# Patient Record
Sex: Female | Born: 1985 | Race: White | Hispanic: No | Marital: Single | Smoking: Former smoker
Health system: Southern US, Community
[De-identification: ages and names within clinical notes are randomized; demographics above are authoritative.]

## PROBLEM LIST (undated history)

## (undated) DIAGNOSIS — M303 Mucocutaneous lymph node syndrome [Kawasaki]: Secondary | ICD-10-CM

## (undated) DIAGNOSIS — A749 Chlamydial infection, unspecified: Secondary | ICD-10-CM

## (undated) DIAGNOSIS — Z8614 Personal history of Methicillin resistant Staphylococcus aureus infection: Secondary | ICD-10-CM

## (undated) DIAGNOSIS — IMO0002 Reserved for concepts with insufficient information to code with codable children: Secondary | ICD-10-CM

## (undated) DIAGNOSIS — R87619 Unspecified abnormal cytological findings in specimens from cervix uteri: Secondary | ICD-10-CM

## (undated) HISTORY — PX: APPENDECTOMY: SHX54

## (undated) HISTORY — PX: COLPOSCOPY: SHX161

---

## 2007-11-28 ENCOUNTER — Emergency Department (HOSPITAL_COMMUNITY): Admission: EM | Admit: 2007-11-28 | Discharge: 2007-11-28 | Payer: Self-pay | Admitting: Emergency Medicine

## 2007-12-24 ENCOUNTER — Encounter: Payer: Self-pay | Admitting: Internal Medicine

## 2008-01-25 ENCOUNTER — Ambulatory Visit: Payer: Self-pay | Admitting: Internal Medicine

## 2008-01-25 DIAGNOSIS — R112 Nausea with vomiting, unspecified: Secondary | ICD-10-CM

## 2010-04-18 ENCOUNTER — Encounter: Payer: Self-pay | Admitting: Internal Medicine

## 2010-04-27 ENCOUNTER — Encounter: Admission: RE | Admit: 2010-04-27 | Payer: Self-pay | Source: Home / Self Care | Admitting: General Surgery

## 2010-07-23 LAB — ABO/RH

## 2010-07-23 LAB — ANTIBODY SCREEN: Antibody Screen: NEGATIVE

## 2010-07-23 LAB — HIV ANTIBODY (ROUTINE TESTING W REFLEX): HIV: NONREACTIVE

## 2010-09-02 ENCOUNTER — Ambulatory Visit: Payer: Self-pay | Admitting: Cardiovascular Disease

## 2010-12-13 LAB — STREP B DNA PROBE: GBS: POSITIVE

## 2010-12-19 ENCOUNTER — Encounter (HOSPITAL_COMMUNITY): Payer: Self-pay | Admitting: *Deleted

## 2010-12-19 ENCOUNTER — Inpatient Hospital Stay (HOSPITAL_COMMUNITY)
Admission: AD | Admit: 2010-12-19 | Discharge: 2010-12-19 | Disposition: A | Discharge status: Home / Self Care | Payer: Self-pay | Source: Ambulatory Visit | Attending: Obstetrics and Gynecology | Admitting: Obstetrics and Gynecology

## 2010-12-19 DIAGNOSIS — O479 False labor, unspecified: Secondary | ICD-10-CM | POA: Insufficient documentation

## 2010-12-19 DIAGNOSIS — O471 False labor at or after 37 completed weeks of gestation: Secondary | ICD-10-CM

## 2010-12-19 NOTE — Progress Notes (Signed)
Pt G1 at 37.6wks, having contractions since 1630, denies bleeding.  Reports a small gush of fluid last night, no fluid since.

## 2010-12-19 NOTE — ED Provider Notes (Signed)
History   Esperanza Koury is a 25y.o. Female at 37.6 weeks who presents for labor check.  Called after-hrs number around 1755 reporting ctxs every 10 minutes for one hr.  Pt given labor precautions and instructed to observe, and called back in an hr reporting ctxs closer and "much stronger," and desired to come in for labor eval.  Once arriving reports ctxs spaced since sitting down and resting.  IC this AM.  No VB, but reports one incident of ?LOF yesterday, but none since.  Denies PIH or UTI s/s; no GI or resp c/o's.  Hx r/f:  1.  Questionable LMP and EDC by uls  2.  Migraines  3.  H/o Kowasaki dz  4.  Questionable inguinal hernia  5.  H/o chlamydia 1st trimester  6.  H/o abnl pap  Chief Complaint  Patient presents with  . Contractions   HPI  OB History    Grav Para Term Preterm Abortions TAB SAB Ect Mult Living   1               No past medical history on file.  No past surgical history on file.  No family history on file.  History  Substance Use Topics  . Smoking status: Not on file  . Smokeless tobacco: Not on file  . Alcohol Use: Not on file    Allergies: Allergies not on file  No prescriptions prior to admission    ROS see HPI Physical Exam   Blood pressure 116/77, pulse 70, temperature 98.2 F (36.8 C), temperature source Oral, resp. rate 18, height 5' 3.5" (1.613 m), weight 84.097 kg (185 lb 6.4 oz).  Physical Exam  Constitutional: She is oriented to person, place, and time. She appears well-developed and well-nourished. No distress.       anxious  Cardiovascular: Normal rate and regular rhythm.   Respiratory: Effort normal and breath sounds normal.  GI: Soft. Bowel sounds are normal.  Genitourinary:       Cx:  1.5/75/-2 posterior  Neurological: She is alert and oriented to person, place, and time.  Skin: Skin is warm and dry.   EFM:  140, reactive, moderate variability, no decels TOCO:  Rare ctx MAU Course  Procedures 1.  NST  Assessment and Plan  1.   IUP at 37.6 2.  No cx change since exam in office last week 3.  Reactive NST  1.  D/c home with labor precautions and FKC 2.  Disc'd comfort measures, benadryl prn insomnia 3.  F/u as scheduled this week at [facility] or prn  Edita Weyenberg H 12/19/2010, 7:56 PM

## 2011-01-01 ENCOUNTER — Inpatient Hospital Stay (HOSPITAL_COMMUNITY)
Admission: AD | Admit: 2011-01-01 | Discharge: 2011-01-02 | DRG: 774 | Disposition: A | Discharge status: Home / Self Care | Payer: Medicaid Other | Source: Ambulatory Visit | Attending: Obstetrics and Gynecology | Admitting: Obstetrics and Gynecology

## 2011-01-01 ENCOUNTER — Inpatient Hospital Stay (HOSPITAL_COMMUNITY): Payer: Medicaid Other | Admitting: Anesthesiology

## 2011-01-01 ENCOUNTER — Encounter (HOSPITAL_COMMUNITY): Payer: Self-pay

## 2011-01-01 ENCOUNTER — Encounter (HOSPITAL_COMMUNITY): Payer: Self-pay | Admitting: Anesthesiology

## 2011-01-01 DIAGNOSIS — O9903 Anemia complicating the puerperium: Secondary | ICD-10-CM | POA: Diagnosis not present

## 2011-01-01 DIAGNOSIS — Z3490 Encounter for supervision of normal pregnancy, unspecified, unspecified trimester: Secondary | ICD-10-CM

## 2011-01-01 DIAGNOSIS — IMO0001 Reserved for inherently not codable concepts without codable children: Secondary | ICD-10-CM

## 2011-01-01 DIAGNOSIS — Z22330 Carrier of Group B streptococcus: Secondary | ICD-10-CM

## 2011-01-01 DIAGNOSIS — O99892 Other specified diseases and conditions complicating childbirth: Secondary | ICD-10-CM | POA: Diagnosis present

## 2011-01-01 DIAGNOSIS — D649 Anemia, unspecified: Secondary | ICD-10-CM | POA: Diagnosis not present

## 2011-01-01 HISTORY — DX: Unspecified abnormal cytological findings in specimens from cervix uteri: R87.619

## 2011-01-01 HISTORY — DX: Chlamydial infection, unspecified: A74.9

## 2011-01-01 HISTORY — DX: Reserved for concepts with insufficient information to code with codable children: IMO0002

## 2011-01-01 HISTORY — DX: Mucocutaneous lymph node syndrome (kawasaki): M30.3

## 2011-01-01 HISTORY — DX: Personal history of Methicillin resistant Staphylococcus aureus infection: Z86.14

## 2011-01-01 LAB — CBC
HCT: 34.3 % — ABNORMAL LOW (ref 36.0–46.0)
MCH: 29.3 pg (ref 26.0–34.0)
MCHC: 33.5 g/dL (ref 30.0–36.0)
MCV: 87.3 fL (ref 78.0–100.0)
RDW: 12.7 % (ref 11.5–15.5)

## 2011-01-01 MED ORDER — BENZOCAINE-MENTHOL 20-0.5 % EX AERO
1.0000 "application " | INHALATION_SPRAY | CUTANEOUS | Status: DC | PRN
  Administered 2011-01-01: 1 via TOPICAL

## 2011-01-01 MED ORDER — HYDROXYZINE HCL 50 MG PO TABS: 50.0000 mg | ORAL_TABLET | Freq: Four times a day (QID) | ORAL | Status: DC | PRN

## 2011-01-01 MED ORDER — AMPICILLIN-SULBACTAM SODIUM 3 (2-1) G IJ SOLR
3.0000 g | Freq: Once | INTRAMUSCULAR | Status: AC
  Administered 2011-01-01: 3 g via INTRAVENOUS
  Filled 2011-01-01: qty 3

## 2011-01-01 MED ORDER — OXYTOCIN 20 UNITS IN LACTATED RINGERS INFUSION - SIMPLE: 125.0000 mL/h | INTRAVENOUS | Status: DC | PRN

## 2011-01-01 MED ORDER — PHENYLEPHRINE 40 MCG/ML (10ML) SYRINGE FOR IV PUSH (FOR BLOOD PRESSURE SUPPORT): 80.0000 ug | PREFILLED_SYRINGE | INTRAVENOUS | Status: DC | PRN

## 2011-01-01 MED ORDER — ONDANSETRON HCL 4 MG PO TABS: 4.0000 mg | ORAL_TABLET | ORAL | Status: DC | PRN

## 2011-01-01 MED ORDER — DIBUCAINE 1 % RE OINT: 1.0000 "application " | TOPICAL_OINTMENT | RECTAL | Status: DC | PRN

## 2011-01-01 MED ORDER — SODIUM CHLORIDE 0.9 % IV SOLN
3.0000 g | Freq: Once | INTRAVENOUS | Status: AC
  Administered 2011-01-01: 3 g via INTRAVENOUS
  Filled 2011-01-01: qty 3

## 2011-01-01 MED ORDER — OXYTOCIN BOLUS FROM INFUSION
500.0000 mL | Freq: Once | INTRAVENOUS | Status: DC
  Filled 2011-01-01: qty 1000
  Filled 2011-01-01: qty 500

## 2011-01-01 MED ORDER — TETANUS-DIPHTH-ACELL PERTUSSIS 5-2.5-18.5 LF-MCG/0.5 IM SUSP
0.5000 mL | Freq: Once | INTRAMUSCULAR | Status: AC
  Administered 2011-01-02: 0.5 mL via INTRAMUSCULAR

## 2011-01-01 MED ORDER — ACETAMINOPHEN 325 MG PO TABS
650.0000 mg | ORAL_TABLET | ORAL | Status: DC | PRN
  Administered 2011-01-01: 650 mg via ORAL
  Filled 2011-01-01: qty 2

## 2011-01-01 MED ORDER — EPHEDRINE 5 MG/ML INJ: 10.0000 mg | INTRAVENOUS | Status: DC | PRN

## 2011-01-01 MED ORDER — LIDOCAINE HCL (PF) 1 % IJ SOLN
30.0000 mL | INTRAMUSCULAR | Status: DC | PRN
  Filled 2011-01-01: qty 30

## 2011-01-01 MED ORDER — IBUPROFEN 600 MG PO TABS
600.0000 mg | ORAL_TABLET | Freq: Four times a day (QID) | ORAL | Status: DC | PRN
  Administered 2011-01-01: 600 mg via ORAL
  Filled 2011-01-01 (×2): qty 1

## 2011-01-01 MED ORDER — LANOLIN HYDROUS EX OINT: TOPICAL_OINTMENT | CUTANEOUS | Status: DC | PRN

## 2011-01-01 MED ORDER — IBUPROFEN 600 MG PO TABS
600.0000 mg | ORAL_TABLET | Freq: Four times a day (QID) | ORAL | Status: DC
  Filled 2011-01-01: qty 1

## 2011-01-01 MED ORDER — PENICILLIN G POTASSIUM 5000000 UNITS IJ SOLR: 5.0000 10*6.[IU] | Freq: Once | INTRAVENOUS | Status: DC

## 2011-01-01 MED ORDER — ONDANSETRON HCL 4 MG/2ML IJ SOLN: 4.0000 mg | Freq: Four times a day (QID) | INTRAMUSCULAR | Status: DC | PRN

## 2011-01-01 MED ORDER — DEXTROSE 5 % IV SOLN: 2.5000 10*6.[IU] | INTRAVENOUS | Status: DC

## 2011-01-01 MED ORDER — OXYCODONE-ACETAMINOPHEN 5-325 MG PO TABS: 2.0000 | ORAL_TABLET | ORAL | Status: DC | PRN

## 2011-01-01 MED ORDER — PRENATAL PLUS 27-1 MG PO TABS
1.0000 | ORAL_TABLET | Freq: Every day | ORAL | Status: DC
  Administered 2011-01-01 – 2011-01-02 (×2): 1 via ORAL
  Filled 2011-01-01 (×2): qty 1

## 2011-01-01 MED ORDER — DIPHENHYDRAMINE HCL 50 MG/ML IJ SOLN: 12.5000 mg | INTRAMUSCULAR | Status: DC | PRN

## 2011-01-01 MED ORDER — SIMETHICONE 80 MG PO CHEW: 80.0000 mg | CHEWABLE_TABLET | ORAL | Status: DC | PRN

## 2011-01-01 MED ORDER — NAPROXEN 250 MG PO TABS
500.0000 mg | ORAL_TABLET | Freq: Two times a day (BID) | ORAL | Status: DC | PRN
  Filled 2011-01-01: qty 2

## 2011-01-01 MED ORDER — BENZOCAINE-MENTHOL 20-0.5 % EX AERO
INHALATION_SPRAY | CUTANEOUS | Status: AC
  Administered 2011-01-01: 1 via TOPICAL
  Filled 2011-01-01: qty 56

## 2011-01-01 MED ORDER — EPHEDRINE 5 MG/ML INJ
10.0000 mg | INTRAVENOUS | Status: DC | PRN
  Filled 2011-01-01: qty 4

## 2011-01-01 MED ORDER — OXYTOCIN 20 UNITS IN LACTATED RINGERS INFUSION - SIMPLE
125.0000 mL/h | Freq: Once | INTRAVENOUS | Status: DC
  Administered 2011-01-01: 500 mL/h via INTRAVENOUS

## 2011-01-01 MED ORDER — FENTANYL 2.5 MCG/ML BUPIVACAINE 1/10 % EPIDURAL INFUSION (WH - ANES)
14.0000 mL/h | INTRAMUSCULAR | Status: DC
  Administered 2011-01-01 (×5): 14 mL/h via EPIDURAL
  Filled 2011-01-01 (×5): qty 60

## 2011-01-01 MED ORDER — ONDANSETRON HCL 4 MG/2ML IJ SOLN: 4.0000 mg | INTRAMUSCULAR | Status: DC | PRN

## 2011-01-01 MED ORDER — DIPHENHYDRAMINE HCL 25 MG PO CAPS: 25.0000 mg | ORAL_CAPSULE | Freq: Four times a day (QID) | ORAL | Status: DC | PRN

## 2011-01-01 MED ORDER — OXYCODONE-ACETAMINOPHEN 5-325 MG PO TABS
1.0000 | ORAL_TABLET | ORAL | Status: DC | PRN
  Administered 2011-01-01 – 2011-01-02 (×5): 1 via ORAL
  Filled 2011-01-01 (×5): qty 1

## 2011-01-01 MED ORDER — WITCH HAZEL-GLYCERIN EX PADS: 1.0000 "application " | MEDICATED_PAD | CUTANEOUS | Status: DC | PRN

## 2011-01-01 MED ORDER — PHENYLEPHRINE 40 MCG/ML (10ML) SYRINGE FOR IV PUSH (FOR BLOOD PRESSURE SUPPORT)
80.0000 ug | PREFILLED_SYRINGE | INTRAVENOUS | Status: DC | PRN
  Filled 2011-01-01: qty 5

## 2011-01-01 MED ORDER — LIDOCAINE HCL 1.5 % IJ SOLN
INTRAMUSCULAR | Status: DC | PRN
  Administered 2011-01-01: 2 mL via INTRADERMAL
  Administered 2011-01-01 (×2): 5 mL via INTRADERMAL

## 2011-01-01 MED ORDER — ZOLPIDEM TARTRATE 5 MG PO TABS: 5.0000 mg | ORAL_TABLET | Freq: Every evening | ORAL | Status: DC | PRN

## 2011-01-01 MED ORDER — PENICILLIN G POTASSIUM 5000000 UNITS IJ SOLR
2.5000 10*6.[IU] | INTRAVENOUS | Status: DC
  Administered 2011-01-01 (×3): 2.5 10*6.[IU] via INTRAVENOUS
  Filled 2011-01-01 (×6): qty 2.5

## 2011-01-01 MED ORDER — CITRIC ACID-SODIUM CITRATE 334-500 MG/5ML PO SOLN: 30.0000 mL | ORAL | Status: DC | PRN

## 2011-01-01 MED ORDER — LACTATED RINGERS IV SOLN
INTRAVENOUS | Status: DC
  Administered 2011-01-01 (×2): via INTRAVENOUS

## 2011-01-01 MED ORDER — FLEET ENEMA 7-19 GM/118ML RE ENEM: 1.0000 | ENEMA | RECTAL | Status: DC | PRN

## 2011-01-01 MED ORDER — HYDROXYZINE HCL 50 MG/ML IM SOLN: 50.0000 mg | Freq: Four times a day (QID) | INTRAMUSCULAR | Status: DC | PRN

## 2011-01-01 MED ORDER — SENNOSIDES-DOCUSATE SODIUM 8.6-50 MG PO TABS
2.0000 | ORAL_TABLET | Freq: Every day | ORAL | Status: DC
  Administered 2011-01-01: 2 via ORAL

## 2011-01-01 MED ORDER — LACTATED RINGERS IV SOLN: 500.0000 mL | Freq: Once | INTRAVENOUS | Status: DC

## 2011-01-01 MED ORDER — PENICILLIN G POTASSIUM 5000000 UNITS IJ SOLR
5.0000 10*6.[IU] | Freq: Once | INTRAVENOUS | Status: AC
  Administered 2011-01-01: 5 10*6.[IU] via INTRAVENOUS
  Filled 2011-01-01: qty 5

## 2011-01-01 MED ORDER — BUTORPHANOL TARTRATE 2 MG/ML IJ SOLN: 1.0000 mg | INTRAMUSCULAR | Status: DC | PRN

## 2011-01-01 MED ORDER — LACTATED RINGERS IV SOLN: 500.0000 mL | INTRAVENOUS | Status: DC | PRN

## 2011-01-01 NOTE — Progress Notes (Addendum)
Dayne L Stifter is a 25 y.o. G1P0 at [redacted]w[redacted]d by LMP admitted for active labor  Subjective:  Pt reports feeling some rectal pressure but overall comfortable with epidural.  Objective:  Alert, oriented, NAD Heart:  RRR Lungs:  Clear Abd:  Soft/gravid/NT EFW 8# to leopolds   FHT:  FHR: 120 bpm, variability: moderate,  accelerations:  Present,  decelerations:  Present intermittent mild variable decels. UC:   regular, every 2-3 minutes SVE:   Dilation 7-8cm Effacement: 100% Station: 0  Labs: Lab Results  Component Value Date   WBC 14.0* 01/01/2011   HGB 11.5* 01/01/2011   HCT 34.3* 01/01/2011   MCV 87.3 01/01/2011   PLT 228 01/01/2011    Assessment / Plan: Spontaneous labor, progressing normally  Labor: Progressing normally Preeclampsia:  no signs or symptoms of toxicity Fetal Wellbeing:  Category II Pain Control:  Epidural I/D:  n/a Anticipated MOD:  NSVD  Lawan Nanez O. 01/01/2011, 4:42 PM  Pts BOE by Ultrasound.

## 2011-01-01 NOTE — Progress Notes (Signed)
Sally Gibson is a 25 y.o. G1P1001 at [redacted]w[redacted]d by ultrasound admitted for active labor  Subjective: Pt resting comfortably at present and reports decreased rectal pressure at present.  Objective: Temp: 99.8 BP:125/60  P: 83  R 16  FHT:  FHR: 150 bpm, variability: moderate,  accelerations:  Present,  decelerations:  Present Intermittent mild variables. UC:   regular, every 2-3 minutes SVE:   Dilation: 9.5, anterior lip-contd edema noted.  OP presentation.   Effacement: 100% Station: +1  Labs: Lab Results  Component Value Date   WBC 14.0* 01/01/2011   HGB 11.5* 01/01/2011   HCT 34.3* 01/01/2011   MCV 87.3 01/01/2011   PLT 228 01/01/2011    Assessment / Plan: Spontaneous labor with no progress  Labor: No progress at present Preeclampsia:  no signs or symptoms of toxicity Fetal Wellbeing:  Category I Pain Control:  Epidural I/D:  n/a Anticipated MOD:  NSVD  IUPC inserted without difficulty to assess uterine contraction strength.  Will begin pitocin if inadequate MVU's and continue to observe carefully.  Position changes to facilitate fetal head rotation.    Grover Robinson O. 01/01/2011, 7:18 PM

## 2011-01-01 NOTE — Plan of Care (Signed)
Problem: Consults Goal: Birthing Suites Patient Information Press F2 to bring up selections list   Pt 37-[redacted] weeks EGA     

## 2011-01-01 NOTE — Anesthesia Procedure Notes (Signed)
Epidural Patient location during procedure: OB Start time: 01/01/2011 4:07 AM Reason for block: procedure for pain  Staffing Performed by: anesthesiologist   Preanesthetic Checklist Completed: patient identified, site marked, surgical consent, pre-op evaluation, timeout performed, IV checked, risks and benefits discussed and monitors and equipment checked  Epidural Patient position: sitting Prep: site prepped and draped and DuraPrep Patient monitoring: continuous pulse ox and blood pressure Approach: midline Injection technique: LOR air  Needle:  Needle type: Tuohy  Needle gauge: 17 G Needle length: 9 cm Needle insertion depth: 6 cm Catheter type: closed end flexible Catheter size: 19 Gauge Catheter at skin depth: 11 cm Test dose: negative  Assessment Events: blood not aspirated, injection not painful, no injection resistance, negative IV test and no paresthesia  Additional Notes Discussed risk of headache, infection, bleeding, nerve injury and failed or incomplete block.  Patient voices understanding and wishes to proceed.

## 2011-01-01 NOTE — Progress Notes (Signed)
Delivery Note At 5:38 PM a viable female was delivered via Vaginal, Spontaneous Delivery (Presentation: Left Occiput Posterior).  No nuchal cord present.  No difficulty with shoulders.   Infant to mat chest upon delivery and had spontaneous cry with stimulation.  Cord doubly clamped and cord cut by FOB.   APGAR: 8, 9; weight 7 lb 12 oz (3515 g).   Placenta status: Intact, Spontaneous.  Cord: 3 vessels with the following complications: None.  Cord pH: N/A  Anesthesia: Epidural  Episiotomy: None Lacerations:1st degree perineal Suture Repair: 3.0 monocryl Est. Blood Loss (mL):  Mom to postpartum.  Baby to nursery-stable.  Sally Ambrocio O. 01/01/2011, 6:34 PM

## 2011-01-01 NOTE — Anesthesia Preprocedure Evaluation (Signed)
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 - has never had any problems or needed followup   Neuro/Psych  Headaches (last migraine first trimester), Negative Psych ROS   GI/Hepatic negative GI ROS Neg liver ROS    Endo/Other  Negative Endocrine ROS  Renal/GU negative Renal ROS     Musculoskeletal   Abdominal   Peds  Hematology negative hematology ROS (+)   Anesthesia Other Findings   Reproductive/Obstetrics (+) Pregnancy                           Anesthesia Physical Anesthesia Plan  ASA: II  Anesthesia Plan: Epidural   Post-op Pain Management:    Induction:   Airway Management Planned:   Additional Equipment:   Intra-op Plan:   Post-operative Plan:   Informed Consent: I have reviewed the patients History and Physical, chart, labs and discussed the procedure including the risks, benefits and alternatives for the proposed anesthesia with the patient or authorized representative who has indicated his/her understanding and acceptance.     Plan Discussed with:   Anesthesia Plan Comments:         Anesthesia Quick Evaluation

## 2011-01-01 NOTE — Progress Notes (Signed)
Patient is here for labor eval. She denies any lof. C/o of ctx q10mins, bloody show. Reports good fetal movement

## 2011-01-01 NOTE — Progress Notes (Signed)
Sally Gibson is a 25 y.o. G1P1001 at [redacted]w[redacted]d by ultrasound admitted for active labor    Subjective: Pt c/o contd rectal pressure.    Objective: Afebrile.  BP 96/62, P 90, R 18      FHT:  FHR: 140 bpm, variability: moderate,  accelerations:  Present,  decelerations:  Absent UC:   regular, every 2-4 minutes SVE:  Dilation: 9.5cm.  Sl swollen anterior lip. Effacement: 100% Station: +1  Labs: Lab Results  Component Value Date   WBC 14.0* 01/01/2011   HGB 11.5* 01/01/2011   HCT 34.3* 01/01/2011   MCV 87.3 01/01/2011   PLT 228 01/01/2011    Assessment / Plan: Spontaneous labor with slow progress Dr. Pennie Rushing updated.  Labor: Slow progress spontaneous labor Preeclampsia:  no signs or symptoms of toxicity Fetal Wellbeing:  Category I Pain Control:  Labor support with Epidural I/D:  n/a Anticipated MOD:  NSVD  Continue with expectant management.  Sally Gibson O. 01/01/2011, 6:56 PM

## 2011-01-01 NOTE — Progress Notes (Addendum)
Eyleen L Schumpert is a 25 y.o. G1P0 at [redacted]w[redacted]d by LMP admitted for active labor  Subjective: Pt reports some contd rectal pressure but some relief after redosing of epidural.  Objective:  AF.  VSS   FHT:  FHR: 120 bpm, variability: moderate,  accelerations:  Present,  decelerations:  Absent UC:   regular, every 2-3 minutes SVE:   Dilation 9cm Effacement: 100% Station: +1  Labs: Lab Results  Component Value Date   WBC 14.0* 01/01/2011   HGB 11.5* 01/01/2011   HCT 34.3* 01/01/2011   MCV 87.3 01/01/2011   PLT 228 01/01/2011    Assessment / Plan: Spontaneous labor, progressing normally  Labor: Progressing normally Preeclampsia:  no signs or symptoms of toxicity Fetal Wellbeing:  Category I Pain Control:  Epidural I/D:  n/a Anticipated MOD:  NSVD  Otie Headlee O. 01/01/2011, 4:46 PM  Pt's BOE by Ultrasound.

## 2011-01-01 NOTE — H&P (Signed)
Sally Gibson is a 25 y.o. female presenting unannounced at 39.5 weeks for labor check.  Desires epidural.  Reports bloody show intermittently yesterday; no LOF, UTI, or PIH s/s.  Desires CNM care.  Significant other at bedside and supportive; pt's mother at bedside as well.  Contractions regular since around Midnight. Cx at last check 2cm. Maternal Medical History:  Reason for admission: Reason for admission: contractions.  Contractions: Onset was 3-5 hours ago.   Frequency: regular.   Perceived severity is strong.   Labor onset around MN  Fetal activity: Perceived fetal activity is normal.   Last perceived fetal movement was within the past hour.    Prenatal complications: 1.  Unsure LMP--dates by 6 week u/s 2.  H/o abnl pap 3.  H/o chlamydia in past and in 1st trimester 4.  GBS pos 5.  H/o Kawasaki dz at 35yrs of age 56.  Questionable inguinal hernia    OB History    Grav Para Term Preterm Abortions TAB SAB Ect Mult Living   1              Past Medical History  Diagnosis Date  . History of MRSA infection     x2  . Kawasaki syndrome     at age 15  . Chlamydia   . Migraine   . Abnormal Pap smear    Past Surgical History  Procedure Date  . Appendectomy   . Colposcopy    Family History: family history is not on file. Social History:  reports that she has quit smoking. Her smoking use included Cigarettes. She does not have any smokeless tobacco history on file. She reports that she does not drink alcohol or use illicit drugs.  Review of Systems  Constitutional: Negative.   Respiratory: Negative.   Cardiovascular: Negative.   Gastrointestinal: Negative.   Genitourinary: Negative.     Dilation: 2.5 Effacement (%): 60 Station: -3 Exam by:: [employer] Blood pressure 139/72, pulse 68, temperature 97.9 F (36.6 C), temperature source Oral, resp. rate 20, SpO2 99.00%. Maternal Exam:  Uterine Assessment: Contraction strength is moderate.  Contraction frequency is  regular.  ctxs q 2-3 min  Abdomen: Patient reports no abdominal tenderness. Estimated fetal weight is 7-8 lbs.   Fetal presentation: vertex  Introitus: Normal vulva. Pelvis: adequate for delivery.   Cervix: Cervix evaluated by digital exam.     Fetal Exam Fetal Monitor Review: Mode: ultrasound.   Baseline rate: 125.  Variability: moderate (6-25 bpm).   Pattern: accelerations present and no decelerations.    Fetal State Assessment: Category I - tracings are normal.     Physical Exam  Constitutional: She is oriented to person, place, and time. She appears well-developed and well-nourished. She appears distressed.       Grimacing, breathing heavily w/ ctxs, tense, difficulty relaxing, hard time even sitting still  Cardiovascular: Normal rate and regular rhythm.   Respiratory: Effort normal and breath sounds normal.  GI: Soft. Bowel sounds are normal.  Genitourinary:       Cx:  3/100/-1, posterior, BBOW  Neurological: She is alert and oriented to person, place, and time. She has normal reflexes.  Skin: Skin is warm and dry.    Prenatal labs: ABO, Rh: A/Positive/-- (04/27 0000) Antibody: Negative (04/27 0000) Rubella: Immune (04/27 0000) RPR: Nonreactive (04/27 0000)  HBsAg: Negative (04/27 0000)  HIV: Non-reactive (04/27 0000)  GBS: Positive (09/17 0000)  1hr gtt nml=119  Assessment/Plan: 1.  IUP at 39.5 2.  Active  labor 3.  GBS positive 4.  Cat I FHT  1.  Admit to BS with rout L&D orders with dr. Pennie Rushing as attending 2.  PCN-G per GBS protocol 3.  Epidural asap 4.  AROM and/or Pitocin prn augmentation 5.  C/w MD prn      Nikyla Navedo H 01/01/2011, 3:47 AM

## 2011-01-01 NOTE — Progress Notes (Signed)
Sally Gibson is a 25 y.o. G1P1001 at [redacted]w[redacted]d by ultrasound admitted for active labor  Subjective: Pt continues to be overall comfortable with some rectal pressure at times.  Called by RN due to elevated temperature.  Tylenol already given.  Objective: Temp:  102.8  BP: 111/55; P:109; R: 18   FHT:  FHR: 160 bpm, variability: moderate,  accelerations:  Present,  decelerations:  Present intermittent variable decels present. UC:   regular, every 2-3 minutes.  IUPC with MVUs 200-210. SVE:   Dilation: 10 Effacement (%): 100 Station: +1 Exam by:: n Sally Gibson, cnm Caput at +2 to +3 Labs: Lab Results  Component Value Date   WBC 14.0* 01/01/2011   HGB 11.5* 01/01/2011   HCT 34.3* 01/01/2011   MCV 87.3 01/01/2011   PLT 228 01/01/2011    Assessment / Plan: Protracted active phase Maternal fever  Labor: Protraced active phase Preeclampsia:  no signs or symptoms of toxicity Fetal Wellbeing:  Category II Pain Control:  Epidural I/D:  n/a Anticipated MOD:  NSVD  Will begin pushing.  Dr. Pennie Rushing updated and additional antibiotics ordered.  Continue to observe closely.  Rhylen Pulido O. 01/01/2011, 8:06 PM

## 2011-01-02 DIAGNOSIS — Z3490 Encounter for supervision of normal pregnancy, unspecified, unspecified trimester: Secondary | ICD-10-CM

## 2011-01-02 DIAGNOSIS — Z22330 Carrier of Group B streptococcus: Secondary | ICD-10-CM

## 2011-01-02 LAB — CBC
HCT: 30.4 % — ABNORMAL LOW (ref 36.0–46.0)
MCH: 29.6 pg (ref 26.0–34.0)
MCV: 89.1 fL (ref 78.0–100.0)
Platelets: 176 10*3/uL (ref 150–400)
RBC: 3.41 MIL/uL — ABNORMAL LOW (ref 3.87–5.11)

## 2011-01-02 MED ORDER — NAPROXEN 500 MG PO TABS: 500.0000 mg | ORAL_TABLET | Freq: Three times a day (TID) | ORAL | Status: AC

## 2011-01-02 MED ORDER — MEDROXYPROGESTERONE ACETATE 150 MG/ML IM SUSP
150.0000 mg | Freq: Once | INTRAMUSCULAR | Status: AC
  Administered 2011-01-02: 150 mg via INTRAMUSCULAR
  Filled 2011-01-02: qty 1

## 2011-01-02 MED ORDER — OXYCODONE-ACETAMINOPHEN 5-325 MG PO TABS: 1.0000 | ORAL_TABLET | ORAL | Status: AC | PRN

## 2011-01-02 NOTE — Progress Notes (Signed)
  Post Partum Day 1 Subjective: no complaints, up ad lib, voiding, tolerating PO and + flatus, No BM, c/o cramping when nursing BF well Mood stable, bonding well Desires early D/C Depo for contraception Plans outpatient circ Peds has released baby after 6pm   Objective: Blood pressure 110/72, pulse 76, temperature 97.8 F (36.6 C), temperature source Oral, resp. rate 17, height 5\' 3"  (1.6 m), weight 83.915 kg (185 lb), SpO2 98.00%, unknown if currently breastfeeding.  Physical Exam:  General: alert and no distress Breasts: soft, nipples intact Lungs: CTAB Heart: RRR Abdomen: soft  Lochia: appropriate Uterine Fundus: firm Incision: healing well, slight edema DVT Evaluation: No evidence of DVT seen on physical exam. Negative Homan's sign. 1+ LE edema  Basename 01/02/11 0530 01/01/11 0242  HGB 10.1* 11.5*  HCT 30.4* 34.3*    Assessment/Plan: Discharge home, Breastfeeding and Lactation consult Plan D/C after 6pm, if remains afebrile Mild anemia  Leukocytosis - afebrile now, temp in labor, received unasyn x2 GBS pos - treated in labor Outpatient circ  Depo-provera    LOS: 1 day   Kizer Nobbe M 01/02/2011, 11:13 AM

## 2011-01-02 NOTE — Anesthesia Postprocedure Evaluation (Signed)
Anesthesia Post Note  Patient: Sally Gibson  Procedure(s) Performed: * No procedures listed *  Anesthesia type: Epidural  Patient location: Mother/Baby  Post pain: Pain level controlled  Post assessment: Post-op Vital signs reviewed  Last Vitals:  Filed Vitals:   01/02/11 0140  BP: 93/60  Pulse: 71  Temp: 98.5 F (36.9 C)  Resp: 16    Post vital signs: Reviewed  Level of consciousness: awake  Complications: No apparent anesthesia complications

## 2011-01-02 NOTE — Anesthesia Postprocedure Evaluation (Signed)
  Anesthesia Post-op Note  Patient: Sally Gibson  Procedure(s) Performed: * No procedures listed *  Patient Location: Mother/Baby  Anesthesia Type: Epidural  Level of Consciousness: awake, alert  and oriented  Airway and Oxygen Therapy: Patient Spontanous Breathing  Post-op Pain: none  Post-op Assessment: Patient's Cardiovascular Status Stable, Respiratory Function Stable, Patent Airway, No signs of Nausea or vomiting and Adequate PO intake  Post-op Vital Signs: stable  Complications: No apparent anesthesia complications

## 2011-01-02 NOTE — Discharge Summary (Signed)
   Obstetric Discharge Summary Reason for Admission: onset of labor Prenatal Procedures: ultrasound Intrapartum Procedures: spontaneous vaginal delivery, GBS prophylaxis and unasyn for fever, epidural Postpartum Procedures: none Complications-Operative and Postpartum: fever during labor  Temp:  [97.8 F (36.6 C)-102.8 F (39.3 C)] 97.8 F (36.6 C) (10/07 0852) Pulse Rate:  [63-126] 76  (10/07 0852) Resp:  [16-20] 17  (10/07 0852) BP: (86-184)/(53-155) 110/72 mmHg (10/07 0852) Hemoglobin  Date Value Range Status  01/02/2011 10.1* 12.0-15.0 (g/dL) Final     HCT  Date Value Range Status  01/02/2011 30.4* 36.0-46.0 (%) Final    Hospital Course:  Pt arrived in active labor at [redacted]w[redacted]d, pt was admitted and given an epidural. PCN was given for GBS prophylaxis, pt was noted to have a fever and also given 2 doses of unasyn 3g IVPB in labor. Fever then resolved. She had a SVD, and then She followed a routine pp course, without any complications.   Discharge Diagnoses: Term Pregnancy-delivered and Amnionitis  Discharge Information: Date: 01/02/2011 Activity: pelvic rest Diet: routine Medications:  Medication List  As of 01/02/2011 11:30 AM   START taking these medications         naproxen 500 MG tablet   Commonly known as: NAPROSYN   Take 1 tablet (500 mg total) by mouth 3 (three) times daily with meals.      oxyCODONE-acetaminophen 5-325 MG per tablet   Commonly known as: PERCOCET   Take 1-2 tablets by mouth every 4 (four) hours as needed (moderate - severe pain).         CONTINUE taking these medications         diphenhydrAMINE 25 MG tablet   Commonly known as: BENADRYL      OMEGA 3-6-9 FATTY ACIDS PO      prenatal vitamin w/FE, FA 27-1 MG Tabs      salicylic acid 17 % gel          Where to get your medications    These are the prescriptions that you need to pick up.   You may get these medications from any pharmacy.         naproxen 500 MG tablet         Information on where to get these meds is not yet available. Ask your nurse or doctor.         oxyCODONE-acetaminophen 5-325 MG per tablet           Condition: stable Instructions: refer to practice specific booklet, depo-provera - receive 2nd dose in 13wks  Discharge to: home   Newborn Data: Live born  Information for the patient's newborn:  Fera, Boy Adina [[id number]]  female  APGAR - 8,9   ; weight 7#12oz ;  Home with mother.  Prerna Harold M 01/02/2011, 11:30 AM

## 2011-01-03 NOTE — Progress Notes (Signed)
UR chart review completed.  

## 2012-04-11 ENCOUNTER — Telehealth: Payer: Self-pay | Admitting: Obstetrics and Gynecology

## 2012-04-11 NOTE — Telephone Encounter (Signed)
Pt called back regarding triage message. Pt c/o vomiting, pelvic pain, , no cycle sine the middle of December Pt has always had irregular cycles Pt went to ER in [place] and had and u/S Pt did not like experience at ER felt the Dr. Was not informative enough etc. Wants second opinion.  Pt was made aware she needs to make an appt. Pt has no INS made aware $150 is due at check in Per Asher Muir pt will be refunded difference depending on what she had done. Pt wants to get hospital records sent to our office. Pt will call back to let us know what she wants to do.

## 2012-04-13 ENCOUNTER — Telehealth: Payer: Self-pay

## 2012-04-13 NOTE — Telephone Encounter (Signed)
Pt was called to be made aware she needs to come in to be evaluated. PER AVS.  Pt not ava Phone went straight to voicemail , but voicemail box is full.    [facility] CMA

## 2012-04-16 ENCOUNTER — Telehealth: Payer: Self-pay | Admitting: Obstetrics and Gynecology

## 2012-04-16 NOTE — Telephone Encounter (Signed)
Pt was called and advised that she needed to come in for an appt per Dr. Stefano Gaul. Pt was advised that she could not be evaluated over the phone .  Pt does not have insurance and will schedule appt when she can. Mathis Bud

## 2012-04-17 ENCOUNTER — Encounter (HOSPITAL_COMMUNITY): Payer: Self-pay | Admitting: Obstetrics and Gynecology

## 2012-04-17 ENCOUNTER — Inpatient Hospital Stay (HOSPITAL_COMMUNITY)
Admission: AD | Admit: 2012-04-17 | Discharge: 2012-04-17 | Disposition: A | Discharge status: Home / Self Care | Payer: Self-pay | Source: Ambulatory Visit | Attending: Obstetrics and Gynecology | Admitting: Obstetrics and Gynecology

## 2012-04-17 ENCOUNTER — Inpatient Hospital Stay (HOSPITAL_COMMUNITY): Payer: Self-pay

## 2012-04-17 DIAGNOSIS — R1032 Left lower quadrant pain: Secondary | ICD-10-CM | POA: Insufficient documentation

## 2012-04-17 DIAGNOSIS — N83201 Unspecified ovarian cyst, right side: Secondary | ICD-10-CM

## 2012-04-17 DIAGNOSIS — N83209 Unspecified ovarian cyst, unspecified side: Secondary | ICD-10-CM | POA: Insufficient documentation

## 2012-04-17 DIAGNOSIS — R1031 Right lower quadrant pain: Secondary | ICD-10-CM | POA: Insufficient documentation

## 2012-04-17 LAB — CBC
HCT: 37.6 % (ref 36.0–46.0)
Hemoglobin: 12.5 g/dL (ref 12.0–15.0)
MCHC: 33.2 g/dL (ref 30.0–36.0)
RDW: 12.1 % (ref 11.5–15.5)
WBC: 7.8 10*3/uL (ref 4.0–10.5)

## 2012-04-17 LAB — URINALYSIS, ROUTINE W REFLEX MICROSCOPIC
Glucose, UA: NEGATIVE mg/dL
Ketones, ur: NEGATIVE mg/dL
Leukocytes, UA: NEGATIVE
Nitrite: NEGATIVE
pH: 6 (ref 5.0–8.0)

## 2012-04-17 LAB — URINE MICROSCOPIC-ADD ON

## 2012-04-17 LAB — WET PREP, GENITAL
WBC, Wet Prep HPF POC: NONE SEEN
Yeast Wet Prep HPF POC: NONE SEEN

## 2012-04-17 LAB — POCT PREGNANCY, URINE: Preg Test, Ur: NEGATIVE

## 2012-04-17 MED ORDER — KETOROLAC TROMETHAMINE 60 MG/2ML IM SOLN
60.0000 mg | Freq: Once | INTRAMUSCULAR | Status: AC
  Administered 2012-04-17: 60 mg via INTRAMUSCULAR
  Filled 2012-04-17: qty 2

## 2012-04-17 MED ORDER — IBUPROFEN 600 MG PO TABS: 600.0000 mg | ORAL_TABLET | Freq: Four times a day (QID) | ORAL | Status: AC | PRN

## 2012-04-17 NOTE — MAU Note (Signed)
Pt reports abd pain x 1 week, states she was seen last week because her period was 2 weeks late, negative preg test. States they did an ultrasound and told her rt ovary "was 2 times the size it should be" was told to follow up with her ob/gyn but she no longer has insurance and did not have 200.00 to go to the office. States the pain is worsening and vicodin is just making her "loopy"

## 2012-04-17 NOTE — MAU Provider Note (Signed)
History     CSN: [id number]  Arrival date and time: 04/17/12 9562   First Provider Initiated Contact with Patient 04/17/12 2042      Chief Complaint  Patient presents with  . Abdominal Pain   HPI Sally Gibson 27 y.o. Has had abdominal pain x one week in RLQ.  Today it is much worse.  Was seen one week ago at an ER in [place].  Has not been able to find somewhere to get an appointment since that visit.  Today the pain is much worse with pain in RLQ and LLQ and radiating to her back.  Last pain medication was narcotic last night.  Made her feel loopy and did not help the pain so she did not take any pain medication today.  States the pain is so severe she is unable to work.  OB History    Grav Para Term Preterm Abortions TAB SAB Ect Mult Living   1 1 1       1       Past Medical History  Diagnosis Date  . History of MRSA infection     x2  . Kawasaki syndrome     at age 64  . Chlamydia   . Migraine   . Abnormal Pap smear     Past Surgical History  Procedure Date  . Appendectomy   . Colposcopy     History reviewed. No pertinent family history.  History  Substance Use Topics  . Smoking status: Former Smoker    Types: Cigarettes  . Smokeless tobacco: Not on file  . Alcohol Use: No    Allergies:  Allergies  Allergen Reactions  . Latex Itching    Vaginal, condoms  . Tramadol Nausea And Vomiting    Prescriptions prior to admission  Medication Sig Dispense Refill  . Aspirin-Salicylamide-Caffeine (BC HEADACHE POWDER PO) Take 1 packet by mouth daily as needed. For headache      . Hydrocodone-Acetaminophen 7.5-300 MG TABS Take 1 tablet by mouth every 6 (six) hours as needed. For pain        Review of Systems  Constitutional: Negative for fever.  Gastrointestinal: Positive for nausea, vomiting and abdominal pain. Negative for diarrhea and constipation.  Genitourinary:       No vaginal discharge. Vaginal bleeding. No dysuria.   Physical Exam   Blood  pressure 98/69, pulse 68, temperature 98.6 F (37 C), temperature source Oral, resp. rate 16, height 5' 3.5" (1.613 m), weight 68.947 kg (152 lb), last menstrual period 04/16/2012, SpO2 100.00%, not currently breastfeeding.  Physical Exam  Nursing note and vitals reviewed. Constitutional: She is oriented to person, place, and time. She appears well-developed and well-nourished.  HENT:  Head: Normocephalic.  Eyes: EOM are normal.  Neck: Neck supple.  GI: Soft. There is tenderness. There is no rebound and no guarding.  Genitourinary:       Speculum exam:  Removed tampon just prior to exam Vagina - No discharge, no odor Cervix -small amount of active bleeding Bimanual exam: Cervix closed Uterus non tender, normal size Adnexa tender on right side, no masses bilaterally, stool palpated in rectum on bimanual exam GC/Chlam, wet prep done Chaperone present for exam.  Musculoskeletal: Normal range of motion.  Neurological: She is alert and oriented to person, place, and time.  Skin: Skin is warm and dry.  Psychiatric: She has a normal mood and affect.    MAU Course  Procedures Results for orders placed during the hospital encounter of 04/17/12 (  from the past 24 hour(s))  URINALYSIS, ROUTINE W REFLEX MICROSCOPIC     Status: Abnormal   Collection Time   04/17/12  7:40 PM      Component Value Range   Color, Urine YELLOW  YELLOW   APPearance CLEAR  CLEAR   Specific Gravity, Urine <1.005 (*) 1.005 - 1.030   pH 6.0  5.0 - 8.0   Glucose, UA NEGATIVE  NEGATIVE mg/dL   Hgb urine dipstick TRACE (*) NEGATIVE   Bilirubin Urine NEGATIVE  NEGATIVE   Ketones, ur NEGATIVE  NEGATIVE mg/dL   Protein, ur NEGATIVE  NEGATIVE mg/dL   Urobilinogen, UA 0.2  0.0 - 1.0 mg/dL   Nitrite NEGATIVE  NEGATIVE   Leukocytes, UA NEGATIVE  NEGATIVE  URINE MICROSCOPIC-ADD ON     Status: Normal   Collection Time   04/17/12  7:40 PM      Component Value Range   Squamous Epithelial / LPF RARE  RARE   WBC, UA 0-2  <3  WBC/hpf   RBC / HPF 0-2  <3 RBC/hpf   Bacteria, UA RARE  RARE  POCT PREGNANCY, URINE     Status: Normal   Collection Time   04/17/12  8:04 PM      Component Value Range   Preg Test, Ur NEGATIVE  NEGATIVE  CBC     Status: Normal   Collection Time   04/17/12  8:55 PM      Component Value Range   WBC 7.8  4.0 - 10.5 K/uL   RBC 4.43  3.87 - 5.11 MIL/uL   Hemoglobin 12.5  12.0 - 15.0 g/dL   HCT 83.1  51.7 - 61.6 %   MCV 84.9  78.0 - 100.0 fL   MCH 28.2  26.0 - 34.0 pg   MCHC 33.2  30.0 - 36.0 g/dL   RDW 07.3  71.0 - 62.6 %   Platelets 287  150 - 400 K/uL  WET PREP, GENITAL     Status: Abnormal   Collection Time   04/17/12  9:10 PM      Component Value Range   Yeast Wet Prep HPF POC NONE SEEN  NONE SEEN   Trich, Wet Prep NONE SEEN  NONE SEEN   Clue Cells Wet Prep HPF POC FEW (*) NONE SEEN   WBC, Wet Prep HPF POC NONE SEEN  NONE SEEN    MDM Clinical Data: Pelvic pain for 1 week.  TRANSABDOMINAL AND TRANSVAGINAL ULTRASOUND OF PELVIS  Technique: Both transabdominal and transvaginal ultrasound  examinations of the pelvis were performed. Transabdominal technique  was performed for global imaging of the pelvis including uterus,  ovaries, adnexal regions, and pelvic cul-de-sac.  It was necessary to proceed with endovaginal exam following the  transabdominal exam to visualize the adnexal structures.  Comparison: None  Findings:  Uterus: The uterus appears to be retroverted. Small amount of free  fluid in the pelvis. Uterus echotexture is normal. Uterus measures  7.4 x 4.6 x 5.1 cm.  Endometrium: Endometrium measures 0.4 cm.  Right ovary: There is a complex hypoechoic structure in the right  ovary. The right ovary itself measures 6.9 x 4.9 x 6.8 cm. This  complex right ovarian lesion measures 6.2 x 5.5 x 6.4 cm. The  complex ovarian lesion contains multiple septations and no  significant vascular flow within the lesion. Findings are  suggestive for a large hemorrhagic cyst.  Left  ovary: Normal appearance of the left ovary with small  follicles. Left ovary measures  3.1 x 1.7 x 1.9 cm.  Other findings: No free fluid  IMPRESSION:  Large complex right ovarian cystic lesion. The sonographic  appearance is suggestive for a hemorrhagic cyst. Due to the size  of the lesion, recommend a 6-12 week follow-up ultrasound to ensure  resolution.   Assessment and Plan  Right ovarian cyst  Plan Follow up in the GYN clinic - message sent to clinic Return here if the pain is severe -much worse than you have had today as this may indicate torsion of the ovary - discussed with client Rx Ibuprofen for pain as Toradol helped client more than the narcotics she was taking at home.  Advised to take with food. Client plans to apply for Medicaid - currently does not have insurance.   Sally Gibson 04/17/2012, 8:49 PM

## 2012-04-18 LAB — GC/CHLAMYDIA PROBE AMP: GC Probe RNA: NEGATIVE

## 2012-04-18 NOTE — MAU Provider Note (Signed)
Attestation of Attending Supervision of Advanced Practitioner (CNM/NP): Evaluation and management procedures were performed by the Advanced Practitioner under my supervision and collaboration.  I have reviewed the Advanced Practitioner's note and chart, and I agree with the management and plan.  Skyra Crichlow 04/18/2012 9:24 AM

## 2012-05-31 ENCOUNTER — Encounter: Payer: Medicaid Other | Admitting: Obstetrics & Gynecology

## 2014-01-27 ENCOUNTER — Encounter (HOSPITAL_COMMUNITY): Payer: Self-pay | Admitting: Obstetrics and Gynecology

## 2014-06-18 ENCOUNTER — Ambulatory Visit: Payer: Self-pay | Admitting: Surgery

## 2014-06-18 NOTE — H&P (Signed)
Sally Gibson 06/18/2014 9:21 AM Location: [facility] Patient #: [id number] DOB: 1985/07/28 Single / Language: [place] / Race: White Female History of Present Illness Molli Hazard B. Daphine Deutscher MD; 06/18/2014 10:10 AM) Patient words: RIH consult  Noticed RIH during pregnancy. It is now bothering her more and she has to manually reduce it. On exam, it appears to be an indirect RIH and the bulge is faint so we are going more on history. It is not in the location of a right femoral hernia.   I discussed open repair with mesh; complications not limited to nerve pain or numbness and recurrence was discussed with her. She wants to proceed with repair.  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  The patient is a 29 year old female   Other Problems Ginnie Smart, [place]; 06/18/2014 9:21 AM) Inguinal Hernia Migraine Headache  Past Surgical History Ginnie Smart, CMA; 06/18/2014 9:21 AM) Appendectomy Tonsillectomy  Diagnostic Studies History Ginnie Smart, [place]; 06/18/2014 9:21 AM) Colonoscopy never Mammogram never Pap Smear 1-5 years ago  Allergies Ginnie Smart, CMA; 06/18/2014 9:22 AM) Latex Itching. TraMADol HCl *ANALGESICS - OPIOID* Nausea, Vomiting.  Medication History Ginnie Smart, [place]; 06/18/2014 9:22 AM) Relpax (  Tablet, Oral) Active. Cyclobenzaprine HCl (  Tablet, Oral) Active.  Social History Ginnie Smart, [place]; 06/18/2014 9:21 AM) Alcohol use Occasional alcohol use. Caffeine use Coffee. No drug use Tobacco use Former smoker.  Family History Ginnie Smart, [place]; 06/18/2014 9:21 AM) Cervical Cancer Mother. Migraine Headache Mother.  Pregnancy / Birth History Ginnie Smart, [place]; 06/18/2014 9:21 AM) Age at menarche 15 years. Contraceptive History Contraceptive implant. Gravida 1 Irregular periods Maternal age 53-25 Para 1     Review of Systems  Marcelino Duster R. Brooks CMA; 06/18/2014 9:21 AM) General Present- Fatigue and Weight Gain. Not Present- Appetite Loss, Chills, Fever, Night Sweats and Weight Loss. Skin Present- Dryness. Not Present- Change in Wart/Mole, Hives, Jaundice, New Lesions, Non-Healing Wounds, Rash and Ulcer. HEENT Not Present- Earache, Hearing Loss, Hoarseness, Nose Bleed, Oral Ulcers, Ringing in the Ears, Seasonal Allergies, Sinus Pain, Sore Throat, Visual Disturbances, Wears glasses/contact lenses and Yellow Eyes. Respiratory Not Present- Bloody sputum, Chronic Cough, Difficulty Breathing, Snoring and Wheezing. Breast Not Present- Breast Mass, Breast Pain, Nipple Discharge and Skin Changes. Cardiovascular Not Present- Chest Pain, Difficulty Breathing Lying Down, Leg Cramps, Palpitations, Rapid Heart Rate, Shortness of Breath and Swelling of Extremities. Gastrointestinal Present- Abdominal Pain, Bloating and Constipation. Not Present- Bloody Stool, Change in Bowel Habits, Chronic diarrhea, Difficulty Swallowing, Excessive gas, Gets full quickly at meals, Hemorrhoids, Indigestion, Nausea, Rectal Pain and Vomiting. Female Genitourinary Not Present- Frequency, Nocturia, Painful Urination, Pelvic Pain and Urgency. Musculoskeletal Present- Joint Stiffness and Muscle Pain. Not Present- Back Pain, Joint Pain, Muscle Weakness and Swelling of Extremities. Neurological Present- Headaches. Not Present- Decreased Memory, Fainting, Numbness, Seizures, Tingling, Tremor, Trouble walking and Weakness. Psychiatric Not Present- Anxiety, Bipolar, Change in Sleep Pattern, Depression, Fearful and Frequent crying. Endocrine Present- Excessive Hunger. Not Present- Cold Intolerance, Hair Changes, Heat Intolerance, Hot flashes and New Diabetes. Hematology Not Present- Easy Bruising, Excessive bleeding, Gland problems, HIV and Persistent Infections.  [employer] R. Brooks CMA; 06/18/2014 9:21 AM) 06/18/2014 9:21 AM Weight: 158.25 lb Height:  63in Body Surface Area: 1.79 m Body Mass Index: 28.03 kg/m BP: 114/72 (Sitting, Left Arm, Standard)     Physical Exam (Aubriegh Minch B. Daphine Deutscher MD; 06/18/2014 10:11 AM)  The physical exam findings are as follows: Note:[id number]  General Note: HEENT normal NECk source of tension headaches Chest clear Heart SR without murmurs Abdomen right inguinal bulge faint consistent with right indirect inguinal hernia. History is one of protrusion requiring manual reduction     Assessment & Plan Molli Hazard(Dilara Navarrete B. Daphine DeutscherMartin MD; 06/18/2014 10:12 AM)  RIGHT INGUINAL HERNIA (550.90  K40.90) Impression: Will schedule open RIH repair with mesh under general

## 2014-06-20 ENCOUNTER — Emergency Department (HOSPITAL_COMMUNITY): Payer: 59

## 2014-06-20 ENCOUNTER — Emergency Department (HOSPITAL_COMMUNITY)
Admission: EM | Admit: 2014-06-20 | Discharge: 2014-06-20 | Disposition: A | Discharge status: Home / Self Care | Payer: 59 | Attending: Emergency Medicine | Admitting: Emergency Medicine

## 2014-06-20 ENCOUNTER — Encounter (HOSPITAL_COMMUNITY): Payer: Self-pay | Admitting: Emergency Medicine

## 2014-06-20 DIAGNOSIS — Z87891 Personal history of nicotine dependence: Secondary | ICD-10-CM | POA: Diagnosis not present

## 2014-06-20 DIAGNOSIS — Z8739 Personal history of other diseases of the musculoskeletal system and connective tissue: Secondary | ICD-10-CM | POA: Insufficient documentation

## 2014-06-20 DIAGNOSIS — Z8619 Personal history of other infectious and parasitic diseases: Secondary | ICD-10-CM | POA: Diagnosis not present

## 2014-06-20 DIAGNOSIS — K2970 Gastritis, unspecified, without bleeding: Secondary | ICD-10-CM | POA: Insufficient documentation

## 2014-06-20 DIAGNOSIS — G43909 Migraine, unspecified, not intractable, without status migrainosus: Secondary | ICD-10-CM | POA: Insufficient documentation

## 2014-06-20 DIAGNOSIS — Z8614 Personal history of Methicillin resistant Staphylococcus aureus infection: Secondary | ICD-10-CM | POA: Diagnosis not present

## 2014-06-20 DIAGNOSIS — Z91040 Latex allergy status: Secondary | ICD-10-CM | POA: Diagnosis not present

## 2014-06-20 DIAGNOSIS — Z3202 Encounter for pregnancy test, result negative: Secondary | ICD-10-CM | POA: Insufficient documentation

## 2014-06-20 DIAGNOSIS — R0602 Shortness of breath: Secondary | ICD-10-CM | POA: Insufficient documentation

## 2014-06-20 DIAGNOSIS — K529 Noninfective gastroenteritis and colitis, unspecified: Secondary | ICD-10-CM | POA: Diagnosis not present

## 2014-06-20 DIAGNOSIS — R1010 Upper abdominal pain, unspecified: Secondary | ICD-10-CM | POA: Diagnosis present

## 2014-06-20 DIAGNOSIS — R1013 Epigastric pain: Secondary | ICD-10-CM

## 2014-06-20 LAB — COMPREHENSIVE METABOLIC PANEL
ALK PHOS: 59 U/L (ref 39–117)
ALT: 15 U/L (ref 0–35)
AST: 20 U/L (ref 0–37)
Albumin: 4.3 g/dL (ref 3.5–5.2)
Anion gap: 7 (ref 5–15)
BILIRUBIN TOTAL: 0.5 mg/dL (ref 0.3–1.2)
BUN: 13 mg/dL (ref 6–23)
CHLORIDE: 105 mmol/L (ref 96–112)
CO2: 25 mmol/L (ref 19–32)
CREATININE: 0.81 mg/dL (ref 0.50–1.10)
Calcium: 8.3 mg/dL — ABNORMAL LOW (ref 8.4–10.5)
GFR calc Af Amer: >90 mL/min (ref >90)
GFR calc non Af Amer: >90 mL/min (ref >90)
Glucose, Bld: 109 mg/dL — ABNORMAL HIGH (ref 70–99)
Potassium: 3.9 mmol/L (ref 3.5–5.1)
Sodium: 137 mmol/L (ref 135–145)
Total Protein: 7 g/dL (ref 6.0–8.3)

## 2014-06-20 LAB — URINALYSIS, ROUTINE W REFLEX MICROSCOPIC
BILIRUBIN URINE: NEGATIVE
Glucose, UA: NEGATIVE mg/dL
Hgb urine dipstick: NEGATIVE
KETONES UR: 15 mg/dL — AB
Leukocytes, UA: NEGATIVE
NITRITE: NEGATIVE
PH: 6 (ref 5.0–8.0)
Protein, ur: NEGATIVE mg/dL
Specific Gravity, Urine: 1.024 (ref 1.005–1.030)
Urobilinogen, UA: 0.2 mg/dL (ref 0.0–1.0)

## 2014-06-20 LAB — CBC WITH DIFFERENTIAL/PLATELET
Basophils Absolute: 0 10*3/uL (ref 0.0–0.1)
Basophils Relative: 0 % (ref 0–1)
EOS ABS: 0 10*3/uL (ref 0.0–0.7)
Eosinophils Relative: 0 % (ref 0–5)
HCT: 40.3 % (ref 36.0–46.0)
Hemoglobin: 13.5 g/dL (ref 12.0–15.0)
LYMPHS PCT: 10 % — AB (ref 12–46)
Lymphs Abs: 0.8 10*3/uL (ref 0.7–4.0)
MCH: 28.3 pg (ref 26.0–34.0)
MCHC: 33.5 g/dL (ref 30.0–36.0)
MCV: 84.5 fL (ref 78.0–100.0)
MONOS PCT: 8 % (ref 3–12)
Monocytes Absolute: 0.7 10*3/uL (ref 0.1–1.0)
Neutro Abs: 6.8 10*3/uL (ref 1.7–7.7)
Neutrophils Relative %: 82 % — ABNORMAL HIGH (ref 43–77)
Platelets: 251 10*3/uL (ref 150–400)
RBC: 4.77 MIL/uL (ref 3.87–5.11)
RDW: 12.5 % (ref 11.5–15.5)
WBC: 8.3 10*3/uL (ref 4.0–10.5)

## 2014-06-20 LAB — I-STAT TROPONIN, ED: Troponin i, poc: 0 ng/mL (ref 0.00–0.08)

## 2014-06-20 LAB — POC URINE PREG, ED: Preg Test, Ur: NEGATIVE

## 2014-06-20 LAB — LIPASE, BLOOD: Lipase: 28 U/L (ref 11–59)

## 2014-06-20 MED ORDER — ACETAMINOPHEN 500 MG PO TABS
1000.0000 mg | ORAL_TABLET | Freq: Once | ORAL | Status: AC
  Administered 2014-06-20: 1000 mg via ORAL
  Filled 2014-06-20: qty 2

## 2014-06-20 MED ORDER — DICYCLOMINE HCL 10 MG PO CAPS
10.0000 mg | ORAL_CAPSULE | Freq: Once | ORAL | Status: AC
  Administered 2014-06-20: 10 mg via ORAL
  Filled 2014-06-20: qty 1

## 2014-06-20 MED ORDER — PANTOPRAZOLE SODIUM 40 MG IV SOLR
40.0000 mg | Freq: Once | INTRAVENOUS | Status: AC
  Administered 2014-06-20: 40 mg via INTRAVENOUS
  Filled 2014-06-20: qty 40

## 2014-06-20 MED ORDER — SODIUM CHLORIDE 0.9 % IV BOLUS (SEPSIS)
1000.0000 mL | Freq: Once | INTRAVENOUS | Status: AC
  Administered 2014-06-20: 1000 mL via INTRAVENOUS

## 2014-06-20 MED ORDER — PANTOPRAZOLE SODIUM 20 MG PO TBEC: 20.0000 mg | DELAYED_RELEASE_TABLET | Freq: Every day | ORAL | Status: AC

## 2014-06-20 MED ORDER — ONDANSETRON HCL 4 MG PO TABS: 4.0000 mg | ORAL_TABLET | Freq: Three times a day (TID) | ORAL | Status: AC | PRN

## 2014-06-20 MED ORDER — ONDANSETRON HCL 4 MG/2ML IJ SOLN
4.0000 mg | Freq: Once | INTRAMUSCULAR | Status: AC
  Administered 2014-06-20: 4 mg via INTRAVENOUS
  Filled 2014-06-20: qty 2

## 2014-06-20 MED ORDER — GI COCKTAIL ~~LOC~~
30.0000 mL | Freq: Once | ORAL | Status: AC
  Administered 2014-06-20: 30 mL via ORAL
  Filled 2014-06-20: qty 30

## 2014-06-20 MED ORDER — DICYCLOMINE HCL 20 MG PO TABS: 20.0000 mg | ORAL_TABLET | Freq: Two times a day (BID) | ORAL | Status: AC

## 2014-06-20 NOTE — Discharge Instructions (Signed)
Viral Gastroenteritis °Viral gastroenteritis is also known as stomach flu. This condition affects the stomach and intestinal tract. It can cause sudden diarrhea and vomiting. The illness typically lasts 3 to 8 days. Most people develop an immune response that eventually gets rid of the virus. While this natural response develops, the virus can make you quite ill. °CAUSES  °Many different viruses can cause gastroenteritis, such as rotavirus or noroviruses. You can catch one of these viruses by consuming contaminated food or water. You may also catch a virus by sharing utensils or other personal items with an infected person or by touching a contaminated surface. °SYMPTOMS  °The most common symptoms are diarrhea and vomiting. These problems can cause a severe loss of body fluids (dehydration) and a body salt (electrolyte) imbalance. Other symptoms may include: °· Fever. °· Headache. °· Fatigue. °· Abdominal pain. °DIAGNOSIS  °Your caregiver can usually diagnose viral gastroenteritis based on your symptoms and a physical exam. A stool sample may also be taken to test for the presence of viruses or other infections. °TREATMENT  °This illness typically goes away on its own. Treatments are aimed at rehydration. The most serious cases of viral gastroenteritis involve vomiting so severely that you are not able to keep fluids down. In these cases, fluids must be given through an intravenous line (IV). °HOME CARE INSTRUCTIONS  °· Drink enough fluids to keep your urine clear or pale yellow. Drink small amounts of fluids frequently and increase the amounts as tolerated. °· Ask your caregiver for specific rehydration instructions. °· Avoid: °· Foods high in sugar. °· Alcohol. °· Carbonated drinks. °· Tobacco. °· Juice. °· Caffeine drinks. °· Extremely hot or cold fluids. °· Fatty, greasy foods. °· Too much intake of anything at one time. °· Dairy products until 24 to 48 hours after diarrhea stops. °· You may consume probiotics.  Probiotics are active cultures of beneficial bacteria. They may lessen the amount and number of diarrheal stools in adults. Probiotics can be found in yogurt with active cultures and in supplements. °· Wash your hands well to avoid spreading the virus. °· Only take over-the-counter or prescription medicines for pain, discomfort, or fever as directed by your caregiver. Do not give aspirin to children. Antidiarrheal medicines are not recommended. °· Ask your caregiver if you should continue to take your regular prescribed and over-the-counter medicines. °· Keep all follow-up appointments as directed by your caregiver. °SEEK IMMEDIATE MEDICAL CARE IF:  °· You are unable to keep fluids down. °· You do not urinate at least once every 6 to 8 hours. °· You develop shortness of breath. °· You notice blood in your stool or vomit. This may look like coffee grounds. °· You have abdominal pain that increases or is concentrated in one small area (localized). °· You have persistent vomiting or diarrhea. °· You have a fever. °· The patient is a child younger than 3 months, and he or she has a fever. °· The patient is a child older than 3 months, and he or she has a fever and persistent symptoms. °· The patient is a child older than 3 months, and he or she has a fever and symptoms suddenly get worse. °· The patient is a baby, and he or she has no tears when crying. °MAKE SURE YOU:  °· Understand these instructions. °· Will watch your condition. °· Will get help right away if you are not doing well or get worse. °Document Released: 03/14/2005 Document Revised: 06/06/2011 Document Reviewed: 12/29/2010 °  ExitCare Patient Information 2015 [employer] [place]. This information is not intended to replace advice given to you by your health care provider. Make sure you discuss any questions you have with your health care provider.  Gastritis, Adult Gastritis is soreness and swelling (inflammation) of the lining of the stomach. Gastritis can  develop as a sudden onset (acute) or long-term (chronic) condition. If gastritis is not treated, it can lead to stomach bleeding and ulcers. CAUSES  Gastritis occurs when the stomach lining is weak or damaged. Digestive juices from the stomach then inflame the weakened stomach lining. The stomach lining may be weak or damaged due to viral or bacterial infections. One common bacterial infection is the Helicobacter pylori infection. Gastritis can also result from excessive alcohol consumption, taking certain medicines, or having too much acid in the stomach.  SYMPTOMS  In some cases, there are no symptoms. When symptoms are present, they may include:  Pain or a burning sensation in the upper abdomen.  Nausea.  Vomiting.  An uncomfortable feeling of fullness after eating. DIAGNOSIS  Your caregiver may suspect you have gastritis based on your symptoms and a physical exam. To determine the cause of your gastritis, your caregiver may perform the following:  Blood or stool tests to check for the H pylori bacterium.  Gastroscopy. A thin, flexible tube (endoscope) is passed down the esophagus and into the stomach. The endoscope has a light and camera on the end. Your caregiver uses the endoscope to view the inside of the stomach.  Taking a tissue sample (biopsy) from the stomach to examine under a microscope. TREATMENT  Depending on the cause of your gastritis, medicines may be prescribed. If you have a bacterial infection, such as an H pylori infection, antibiotics may be given. If your gastritis is caused by too much acid in the stomach, H2 blockers or antacids may be given. Your caregiver may recommend that you stop taking aspirin, ibuprofen, or other nonsteroidal anti-inflammatory drugs (NSAIDs). HOME CARE INSTRUCTIONS  Only take over-the-counter or prescription medicines as directed by your caregiver.  If you were given antibiotic medicines, take them as directed. Finish them even if you start  to feel better.  Drink enough fluids to keep your urine clear or pale yellow.  Avoid foods and drinks that make your symptoms worse, such as:  Caffeine or alcoholic drinks.  Chocolate.  Peppermint or mint flavorings.  Garlic and onions.  Spicy foods.  Citrus fruits, such as oranges, lemons, or limes.  Tomato-based foods such as sauce, chili, salsa, and pizza.  Fried and fatty foods.  Eat small, frequent meals instead of large meals. SEEK IMMEDIATE MEDICAL CARE IF:   You have black or dark red stools.  You vomit blood or material that looks like coffee grounds.  You are unable to keep fluids down.  Your abdominal pain gets worse.  You have a fever.  You do not feel better after 1 week.  You have any other questions or concerns. MAKE SURE YOU:  Understand these instructions.  Will watch your condition.  Will get help right away if you are not doing well or get worse. Document Released: 03/08/2001 Document Revised: 09/13/2011 Document Reviewed: 04/27/2011 [facility] Patient Information 2015 [place], [place]. This information is not intended to replace advice given to you by your health care provider. Make sure you discuss any questions you have with your health care provider.

## 2014-06-20 NOTE — Progress Notes (Signed)
[facility] is [facility] [address], [place], [place] [address] [phone] EPIC updated

## 2014-06-20 NOTE — ED Notes (Addendum)
Pt from home c/o low back pain. She reports being nauseated and thinking she had the stomach virus but denies vomiting or diarrhea just nausea. She reports a decreased appetite. She denies urinary symptoms.

## 2014-06-20 NOTE — ED Provider Notes (Signed)
CSN: [id number]     Arrival date & time 06/20/14  1417 History   First MD Initiated Contact with Patient 06/20/14 1514     Chief Complaint  Patient presents with  . Back Pain  . Abdominal Pain     (Consider location/radiation/quality/duration/timing/severity/associated sxs/prior Treatment) HPI Sally Gibson is a 29 year old female with past medical history of migraines who presents the ER with multiple complaints. Patient reports a gradual onset of cramping upper abdominal pain over the past week. Patient states her symptoms have been intermittent with persistent nausea. Patient states she has 2 recent sick contacts of her husband and child who have experienced similar symptoms over the past week. Patient states she has had similar symptoms in the past when he ibuprofen. Patient states she took ibuprofen earlier this week and noticed worsening of her pain. Patient reports associated anorexia throughout the week. Patient denies associated vomiting, diarrhea, dysuria. Patient also complains of what she describes as shortness of breath. Patient states she is not sure how to describe the shortness of breath other than saying earlier today she experienced several episodes of "feeling like I was not satisfied with my breathing". Patient states his episodes were transient, and she is not experiencing any of these symptoms in the ED. patient also reports associated lower back pain which is worse with sitting up or movement, worse with lifting her arms up. Patient states she has a history of lower back pain and this feels similar. Patient denies chest pain, palpitations, dizziness, weakness, lightheadedness, vaginal discharge, vaginal bleeding.   Past Medical History  Diagnosis Date  . History of MRSA infection     x2  . Kawasaki syndrome     at age 24  . Chlamydia   . Migraine   . Abnormal Pap smear    Past Surgical History  Procedure Laterality Date  . Appendectomy    . Colposcopy     No family  history on file. History  Substance Use Topics  . Smoking status: Former Smoker    Types: Cigarettes  . Smokeless tobacco: Not on file  . Alcohol Use: No   OB History    Gravida Para Term Preterm AB TAB SAB Ectopic Multiple Living   Review of Systems  Constitutional: Negative for fever.  HENT: Negative for trouble swallowing.   Eyes: Negative for visual disturbance.  Respiratory: Positive for shortness of breath. Negative for cough.   Cardiovascular: Negative for chest pain.  Gastrointestinal: Positive for nausea and abdominal pain. Negative for vomiting.  Genitourinary: Negative for dysuria.  Musculoskeletal: Negative for neck pain.  Skin: Negative for rash.  Neurological: Negative for dizziness, weakness and numbness.  Psychiatric/Behavioral: Negative.       Allergies  Latex and Tramadol  Home Medications   Prior to Admission medications   Medication Sig Start Date End Date Taking? Authorizing Provider  Aspirin-Salicylamide-Caffeine (BC HEADACHE POWDER PO) Take 1 packet by mouth daily as needed. For headache   Yes Historical Provider, MD  cyclobenzaprine (FLEXERIL) 10 MG tablet Take 1 tablet by mouth 3 (three) times daily as needed for muscle spasms.  05/13/14  Yes Historical Provider, MD  etonogestrel (NEXPLANON) 68 MG IMPL implant 1 each by Subdermal route once.   Yes Historical Provider, MD  RELPAX 40 MG tablet Take 1 tablet by mouth every 6 (six) hours. 06/18/14  Yes Historical Provider, MD  dicyclomine (BENTYL) 20 MG tablet Take 1 tablet (  20 mg total) by mouth 2 (two) times daily. 06/20/14   Ladona Mow, PA-C  ibuprofen (ADVIL,MOTRIN) 600 MG tablet Take 1 tablet (600 mg total) by mouth every 6 (six) hours as needed for pain. Patient not taking: Reported on 06/20/2014 04/17/12   Currie Paris, NP  ondansetron (ZOFRAN) 4 MG tablet Take 1 tablet (4 mg total) by mouth every 8 (eight) hours as needed for nausea or vomiting. 06/20/14   Ladona Mow, PA-C   pantoprazole (PROTONIX) 20 MG tablet Take 1 tablet (20 mg total) by mouth daily. 06/20/14   Ladona Mow, PA-C   BP 93/47 mmHg  Pulse 73  Temp(Src) 98.2 F (36.8 C) (Oral)  Resp 16  SpO2 100%  LMP 06/19/2014 Physical Exam  Constitutional: She is oriented to person, place, and time. She appears well-developed and well-nourished. No distress.  HENT:  Head: Normocephalic and atraumatic.  Mouth/Throat: Oropharynx is clear and moist. No oropharyngeal exudate.  Eyes: Pupils are equal, round, and reactive to light. Right eye exhibits no discharge. Left eye exhibits no discharge. No scleral icterus.  Neck: Normal range of motion.  Cardiovascular: Normal rate, regular rhythm and normal heart sounds.   No murmur heard. Pulmonary/Chest: Effort normal and breath sounds normal. No respiratory distress.  Abdominal: Soft. Normal appearance and bowel sounds are normal. There is tenderness in the epigastric area and left upper quadrant. There is no rigidity, no guarding, no tenderness at McBurney's point and negative Murphy's sign.  Musculoskeletal: Normal range of motion. She exhibits no edema or tenderness.  Neurological: She is alert and oriented to person, place, and time. No cranial nerve deficit. Coordination normal.  Skin: Skin is warm and dry. No rash noted. She is not diaphoretic.  Psychiatric: She has a normal mood and affect.  Nursing note and vitals reviewed.   ED Course  Procedures (including critical care time) Labs Review Labs Reviewed  CBC WITH DIFFERENTIAL/PLATELET - Abnormal; Notable for the following:    Neutrophils Relative % 82 (*)    Lymphocytes Relative 10 (*)    All other components within normal limits  COMPREHENSIVE METABOLIC PANEL - Abnormal; Notable for the following:    Glucose, Bld 109 (*)    Calcium 8.3 (*)    All other components within normal limits  URINALYSIS, ROUTINE W REFLEX MICROSCOPIC - Abnormal; Notable for the following:    Ketones, ur 15 (*)    All  other components within normal limits  LIPASE, BLOOD  POC URINE PREG, ED  I-STAT TROPOININ, ED    Imaging Review Dg Chest 2 View  06/20/2014   CLINICAL DATA:  Epigastric pain and nausea, shortness of breath and fever for 1 day  EXAM: CHEST  2 VIEW  COMPARISON:  None.  FINDINGS: The heart size and mediastinal contours are within normal limits. Both lungs are clear. The visualized skeletal structures are unremarkable.  IMPRESSION: No active cardiopulmonary disease.   Electronically Signed   By: Christiana Pellant M.D.   On: 06/20/2014 15:56     EKG Interpretation None      MDM   Final diagnoses:  Epigastric pain  Gastroenteritis  Gastritis    Patient with symptoms consistent with viral gastroenteritis.  Vitals are stable, no fever.  Patient's signs and symptoms improved while in the ED. Patient's mild back pain consistent with a muscular skeletal back pain.  No neurological deficits and normal neuro exam.  Patient can walk but states is painful.  No loss of bowel or bladder control.  No concern for cauda equina.  No fever, night sweats, weight loss, h/o cancer, IVDU.  RICE protocol and pain medicine indicated and discussed with patient. No signs of dehydration, tolerating PO fluids > 6 oz.  Lungs are clear.  No focal abdominal pain, no concern for  appendicitis, cholecystitis, pancreatitis, ruptured viscus, UTI, kidney stone, or any other abdominal etiology/acute abdomen.  Patient discharged with symptomatic therapy. Discussed return precautions with patient, and strongly encouraged patient to follow up with her primary care physician. Patient verbalized understanding and agreement of this plan. I encouraged patient to call or return to the ER with any questions or concerns.  BP 93/47 mmHg  Pulse 73  Temp(Src) 98.2 F (36.8 C) (Oral)  Resp 16  SpO2 100%  LMP 06/19/2014  Signed,  Ladona MowJoe Jenene Kauffmann, PA-C 1:41 AM  Patient discussed with Dr. Carmell AustriaNate Pickering, MD    Ladona MowJoe Cristine Daw, PA-C 06/21/14  [id number]  Benjiman CoreNathan Pickering, MD 06/22/14 0005

## 2014-11-24 ENCOUNTER — Other Ambulatory Visit: Payer: Self-pay | Admitting: Family Medicine

## 2014-11-24 DIAGNOSIS — R109 Unspecified abdominal pain: Secondary | ICD-10-CM

## 2014-11-26 ENCOUNTER — Other Ambulatory Visit: Payer: 59

## 2016-06-21 ENCOUNTER — Other Ambulatory Visit: Payer: Self-pay | Admitting: Family Medicine

## 2016-06-21 DIAGNOSIS — R51 Headache: Principal | ICD-10-CM

## 2016-06-21 DIAGNOSIS — R519 Headache, unspecified: Secondary | ICD-10-CM

## 2016-07-14 ENCOUNTER — Ambulatory Visit
Admission: RE | Admit: 2016-07-14 | Discharge: 2016-07-14 | Disposition: A | Discharge status: Home / Self Care | Payer: BLUE CROSS/BLUE SHIELD | Source: Ambulatory Visit | Attending: Family Medicine | Admitting: Family Medicine

## 2016-07-14 DIAGNOSIS — R519 Headache, unspecified: Secondary | ICD-10-CM

## 2016-07-14 DIAGNOSIS — R51 Headache: Principal | ICD-10-CM

## 2016-07-14 MED ORDER — GADOBENATE DIMEGLUMINE 529 MG/ML IV SOLN
14.0000 mL | Freq: Once | INTRAVENOUS | Status: AC | PRN
  Administered 2016-07-14: 14 mL via INTRAVENOUS

## 2017-08-19 IMAGING — MR MR HEAD WO/W CM
10 series · 48 of 48 positions shown · IV contrast (Multihance 14ml)
Comparison: None.

CLINICAL DATA: Migraines. Severe headaches that worsened with
exertion or positional changes, including from moving from lying
down to sitting or standing.

EXAM:
MRI HEAD WITHOUT AND WITH CONTRAST
TECHNIQUE: Multiplanar, multiecho pulse sequences of the brain and surrounding
structures were obtained without and with intravenous contrast.
CONTRAST:  14mL MULTIHANCE GADOBENATE DIMEGLUMINE 529 MG/ML IV SOLN

[Series 5: T1 · sagittal · 4.0mm · 0.75mm/px · 3 of 27 slices shown (1 of 3)]
[im 1/27]
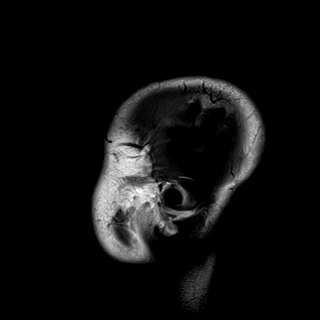
[im 14/27]
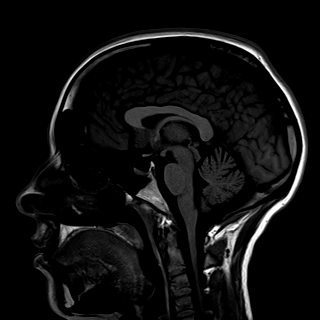
[im 27/27]
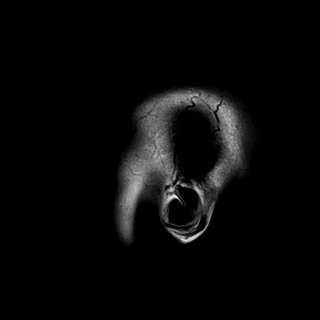

[Series 6: T2 · axial · 4.0mm · 0.36mm/px · z∈[-63,+72]mm · 2 of 27 slices shown]
[im 1/27]
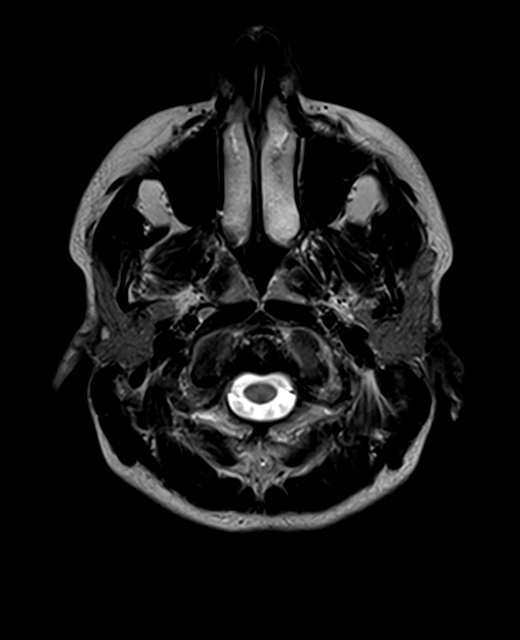
[im 27/27]
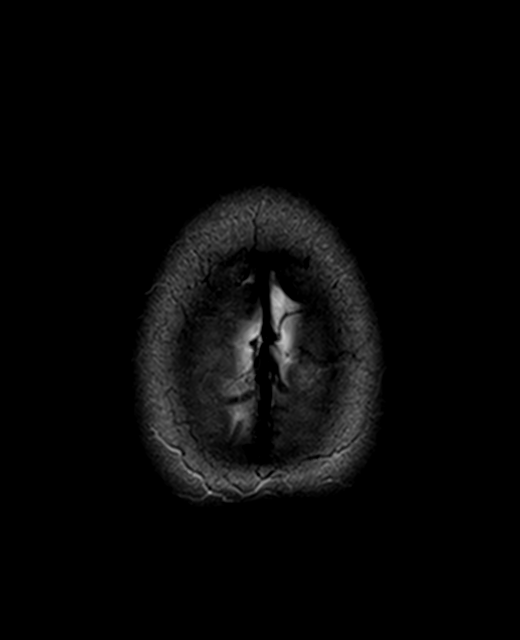

[Series 7: DWI · axial · 3.0mm · 1.44mm/px · z∈[-63,+72]mm · 6 of 84 slices shown (1 of 2)]
[im 1/84]
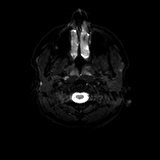
[im 17/84]
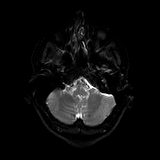
[im 34/84]
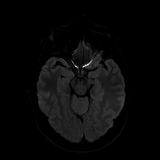
[im 50/84]
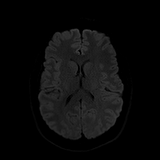
[im 67/84]
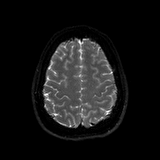
[im 84/84]
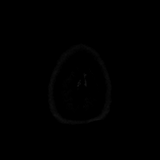

[Series 8: DWI · axial · 3.0mm · 1.44mm/px · z∈[-63,+72]mm · 3 of 42 slices shown (2 of 2)]
[im 1/42]
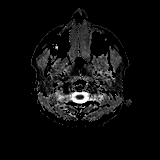
[im 21/42]
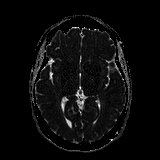
[im 42/42]
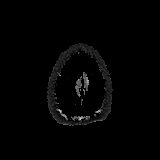

[Series 10: swi_images · axial · 2.0mm · 0.90mm/px · z∈[-66,+76]mm · 5 of 72 slices shown]
[im 1/72]
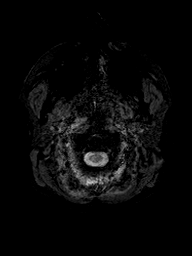
[im 18/72]
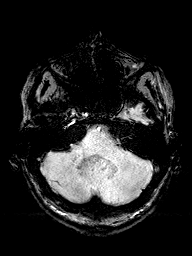
[im 36/72]
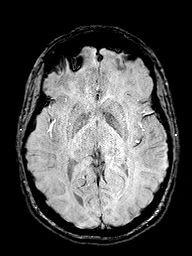
[im 54/72]
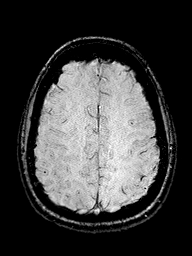
[im 72/72]
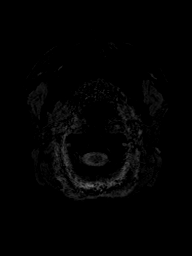

[Series 11: FLAIR · axial · 3.0mm · 0.72mm/px · z∈[-66,+72]mm · 3 of 38 slices shown]
[im 1/38]
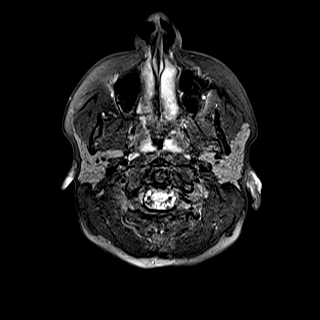
[im 19/38]
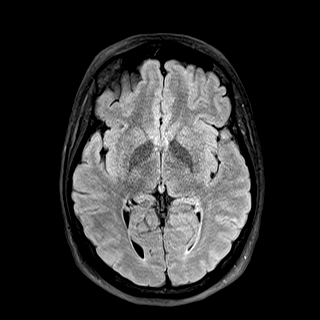
[im 38/38]
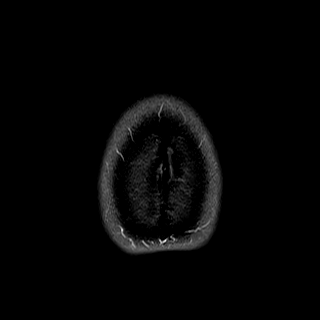

[Series 12: T1 · axial · 1.0mm · 0.90mm/px · z∈[-66,+76]mm · 11 of 144 slices shown (2 of 3)]
[im 1/144]
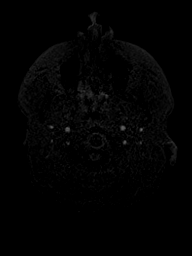
[im 15/144]
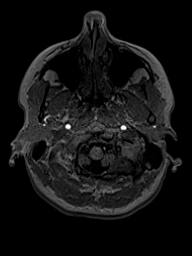
[im 29/144]
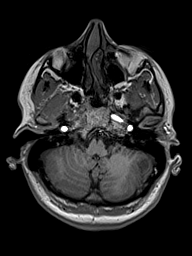
[im 43/144]
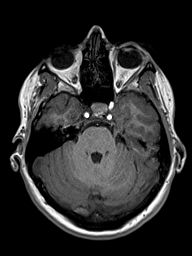
[im 58/144]
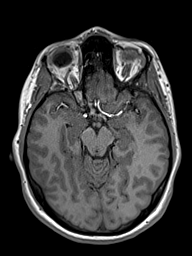
[im 72/144]
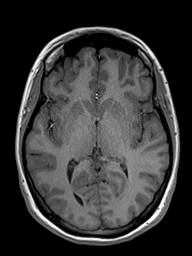
[im 86/144]
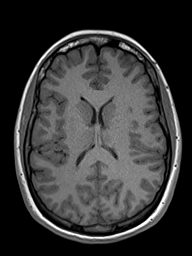
[im 101/144]
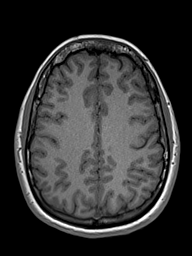
[im 115/144]
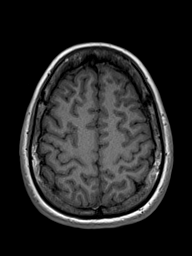
[im 129/144]
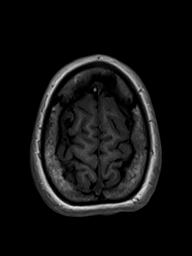
[im 144/144]
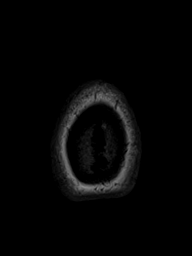

[Series 13: T2 post-contrast · coronal · 4.0mm · 0.36mm/px · 2 of 32 slices shown]
[im 1/32]
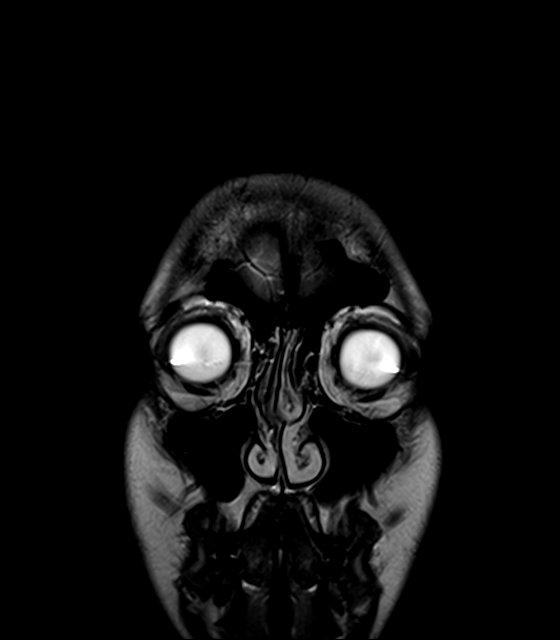
[im 32/32]
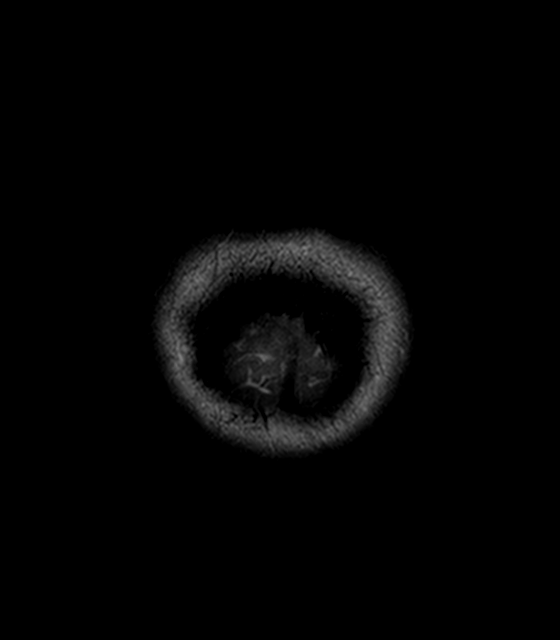

[Series 14: T1 · axial · 1.0mm · 0.90mm/px · z∈[-66,+76]mm · 11 of 144 slices shown (3 of 3)]
[im 1/144]
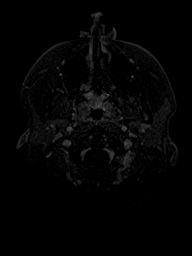
[im 15/144]
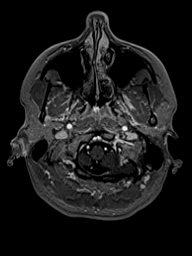
[im 29/144]
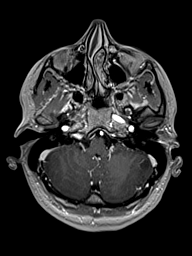
[im 43/144]
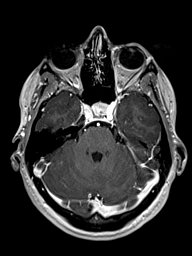
[im 58/144]
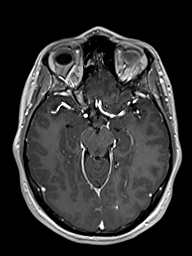
[im 72/144]
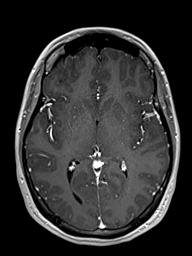
[im 86/144]
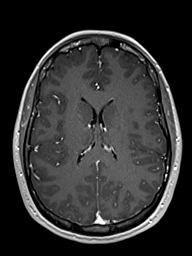
[im 101/144]
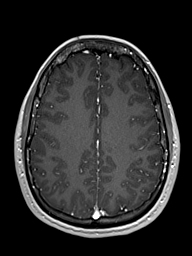
[im 115/144]
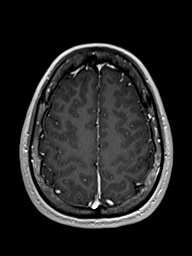
[im 129/144]
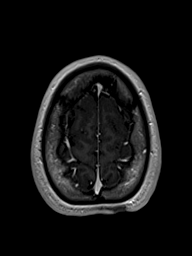
[im 144/144]
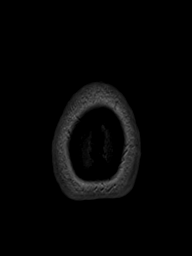

[Series 16: T1 post-contrast · coronal · 4.0mm · 0.72mm/px · 2 of 32 slices shown]
[im 1/32]
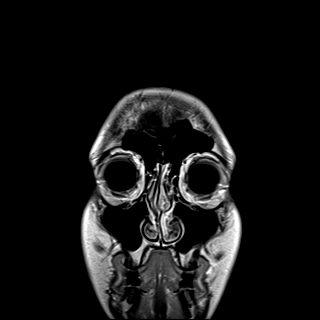
[im 32/32]
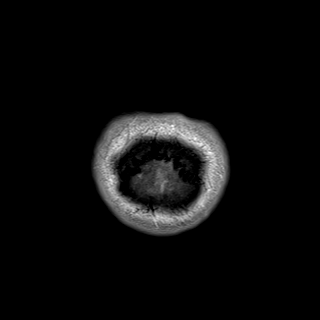

[48 of 48 positions shown; findings below may reference images not displayed]

FINDINGS: Brain: No focal diffusion restriction to indicate acute infarct. No
intraparenchymal hemorrhage. The brain parenchymal signal is normal.
No mass lesion or midline shift. No hydrocephalus or extra-axial
fluid collection. The midline structures are normal. No age advanced
or lobar predominant atrophy.

There is no sagging of the brainstem. No abnormal contour or of the
transverse venous sinuses. No pachymeningeal contrast enhancement.

Vascular: Major intracranial arterial and venous sinus flow voids
are preserved. No evidence of chronic microhemorrhage or amyloid
angiopathy.

Skull and upper cervical spine: The visualized skull base,
calvarium, upper cervical spine and extracranial soft tissues are
normal.

Sinuses/Orbits: No fluid levels or advanced mucosal thickening. No
mastoid effusion. Normal orbits.
IMPRESSION: 1. Though the reported history suggests possible intracranial
hypotension, there are no findings on this examination to support
that diagnosis. CSF sampling with opening pressure should be
considered.
2. No neoplasm or other finding to explain the reported headaches.
